# Patient Record
Sex: Male | Born: 1990 | Race: White | Hispanic: No | Marital: Single | Smoking: Former smoker
Health system: Southern US, Community
[De-identification: ages and names within clinical notes are randomized; demographics above are authoritative.]

## PROBLEM LIST (undated history)

## (undated) DIAGNOSIS — F909 Attention-deficit hyperactivity disorder, unspecified type: Secondary | ICD-10-CM

---
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
## 2009-01-05 IMAGING — CR DG FEMUR 2+V*R*
7 series · 7 of 7 positions shown · non-contrast
Comparison: none

CLINICAL DATA: Right femur pain and deformity following trauma.

RIGHT FEMUR - 7 VIEW

[w hip lateral right * (1 of 4)]
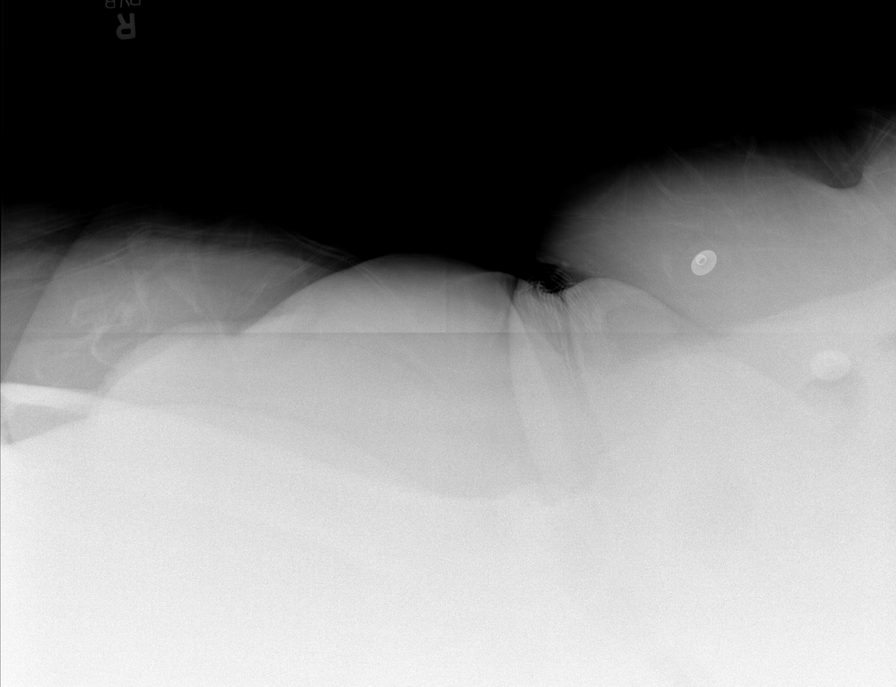

[w hip lateral right * (2 of 4)]
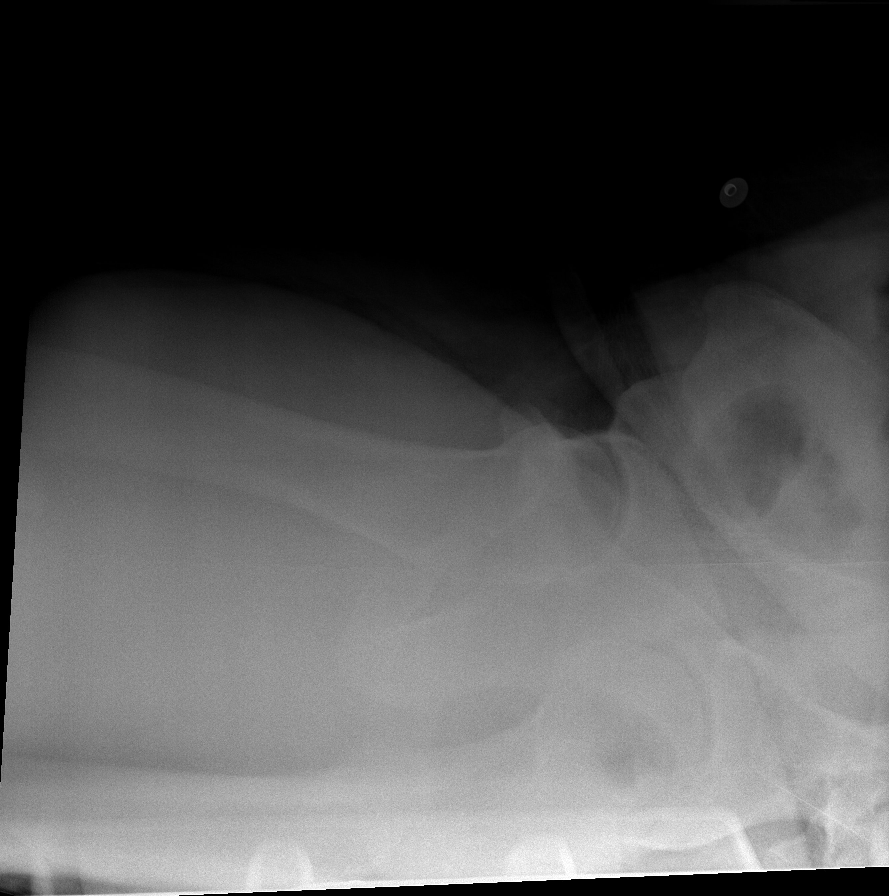

[w hip lateral right * (3 of 4)]
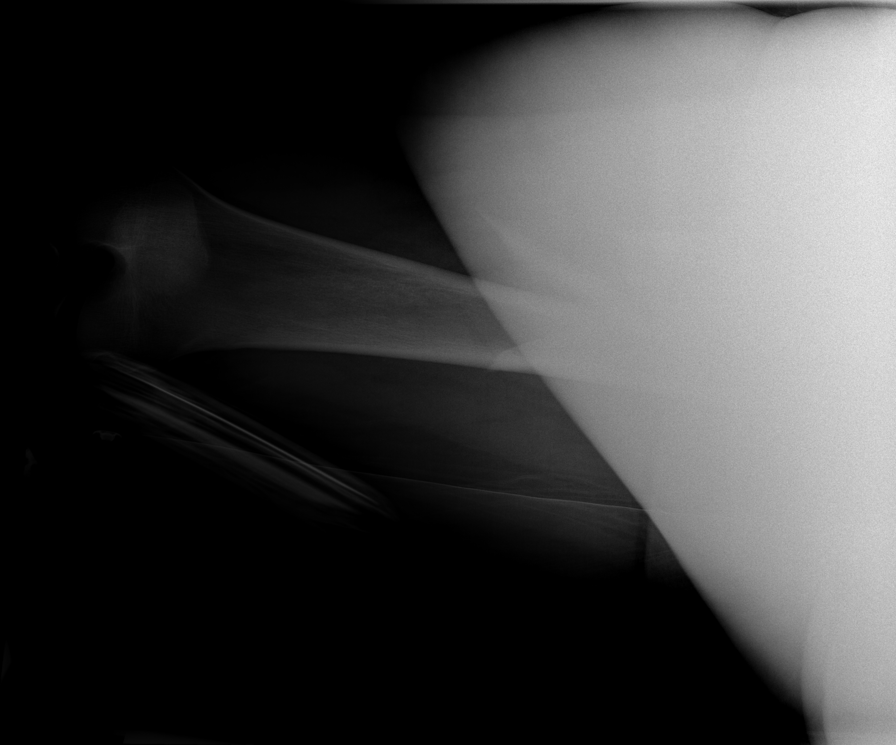

[w hip lateral right * (4 of 4)]
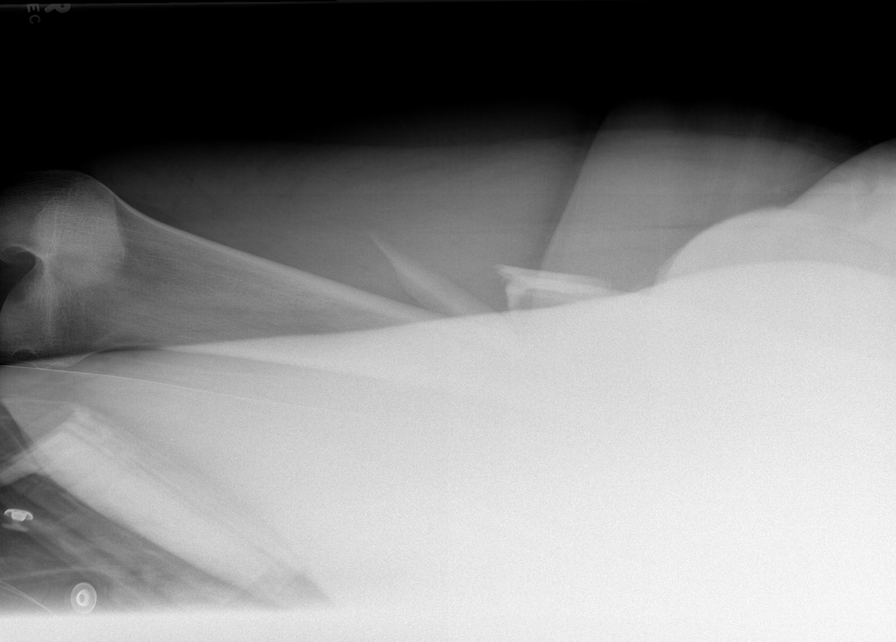

[view not recorded (1 of 3)]
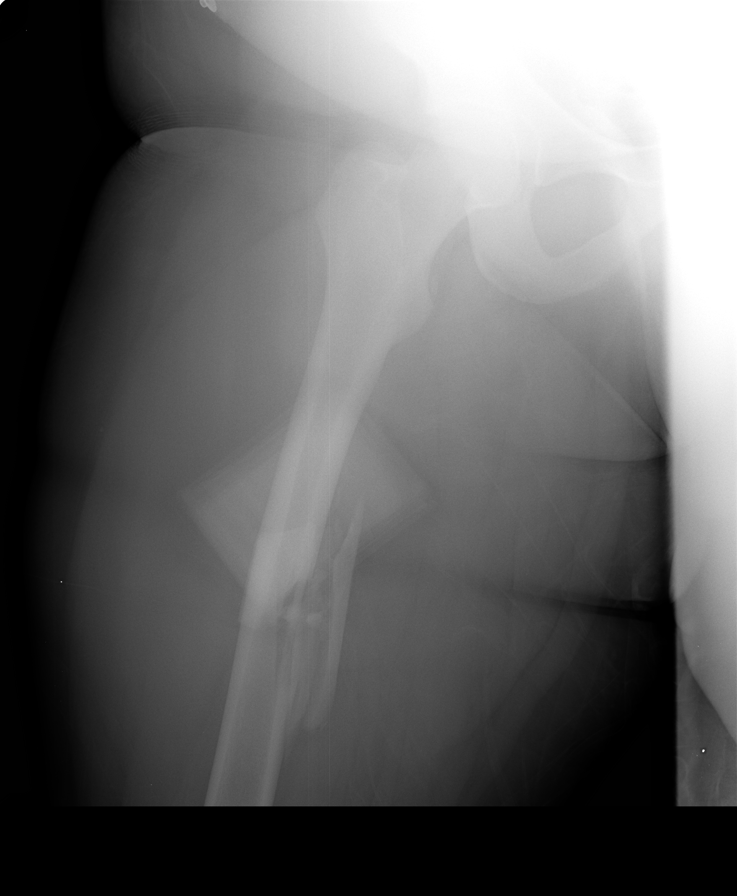

[view not recorded (2 of 3)]
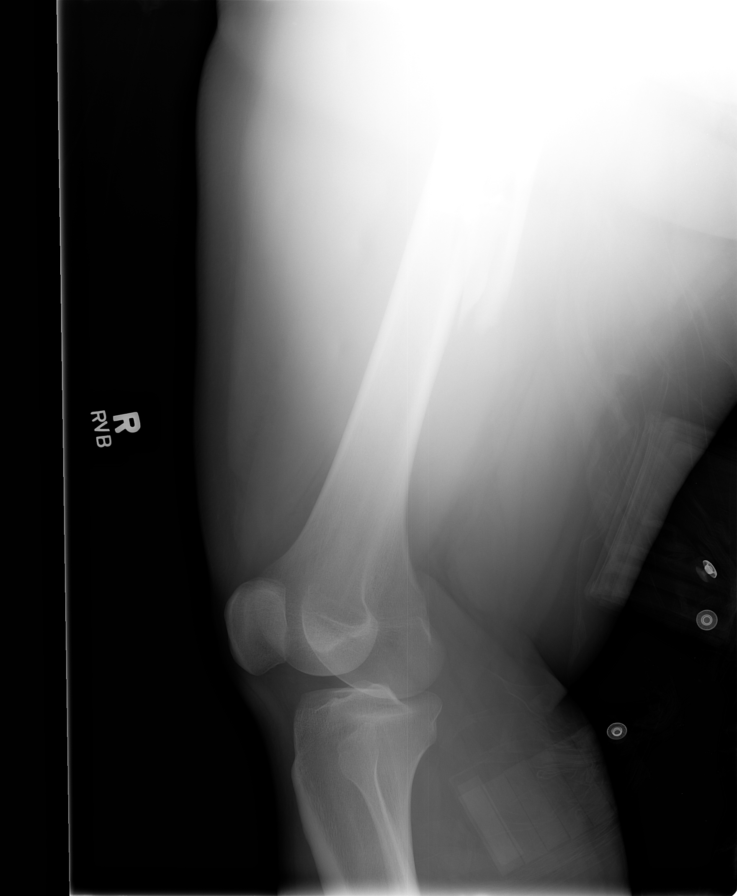

[view not recorded (3 of 3)]
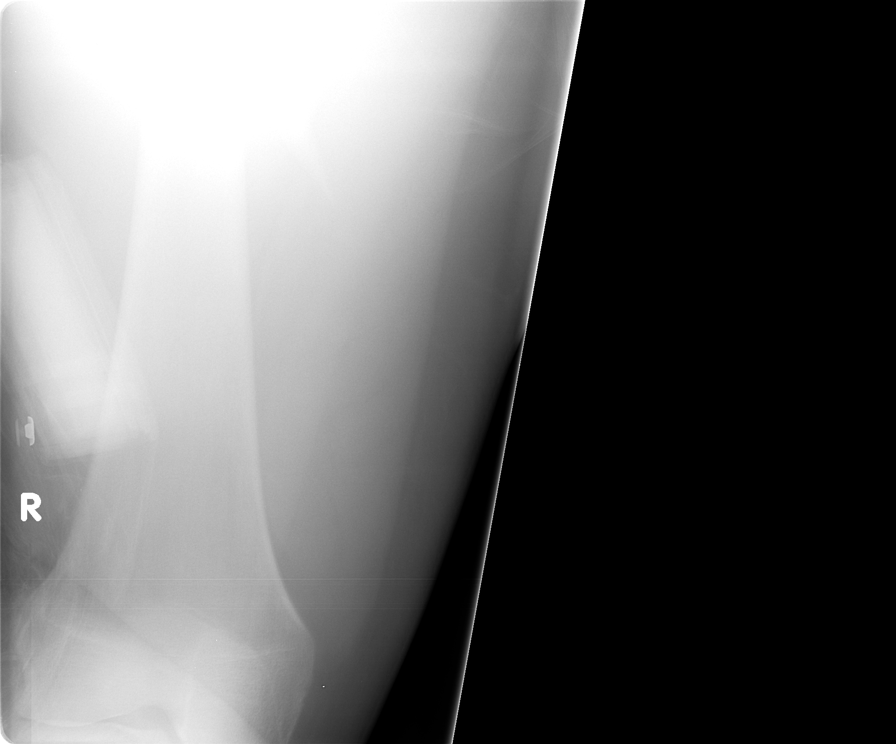

[7 of 7 positions shown; findings below may reference images not displayed]

FINDINGS: Comminuted fracture of the midshaft of the femur with one shaft width
of medial and posterior displacement of the middle fragment. There is also
proximal displacement of the distal fragment.

IMPRESSION

Comminuted mid right femoral shaft fracture, as described above.

## 2009-01-06 IMAGING — RF DG FEMUR 2+V*R*
1 series · 7 of 7 positions shown · non-contrast
Comparison: none

CLINICAL DATA: ORIF right femur

RIGHT FEMUR - 2 VIEW

[Series 1: run · 7 of 7 slices shown]
[im 1/7]
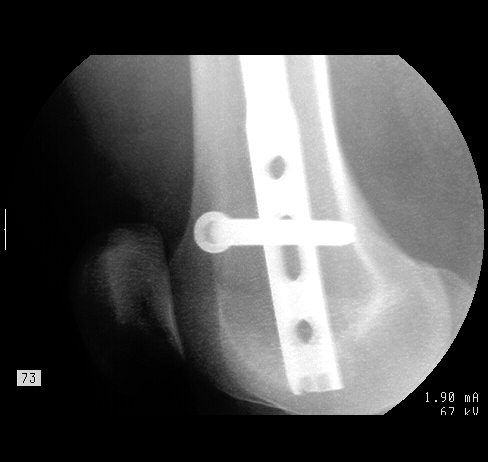
[im 2/7]
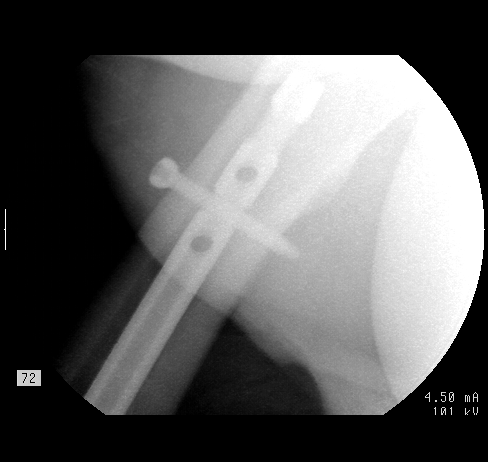
[im 3/7]
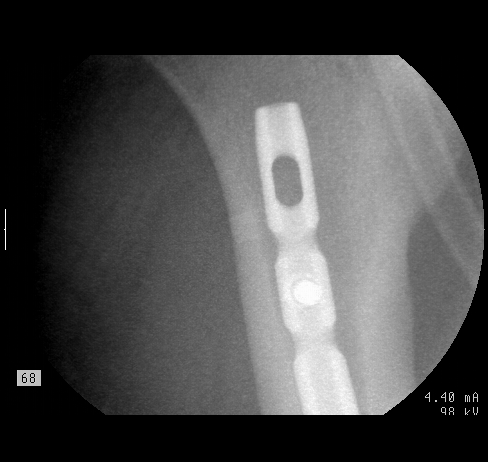
[im 4/7]
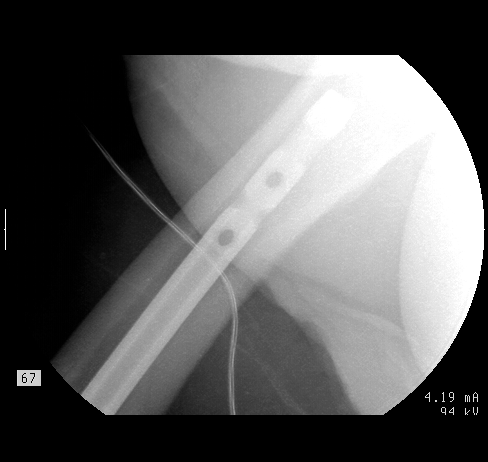
[im 5/7]
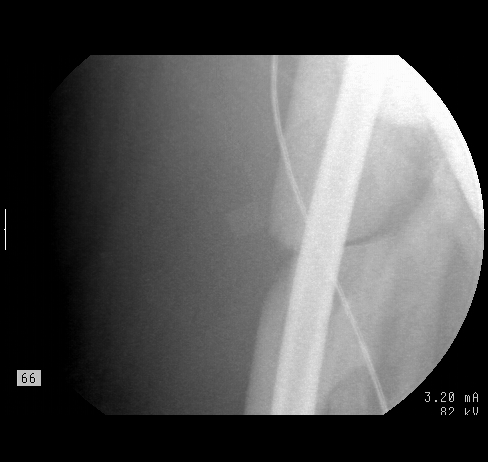
[im 6/7]
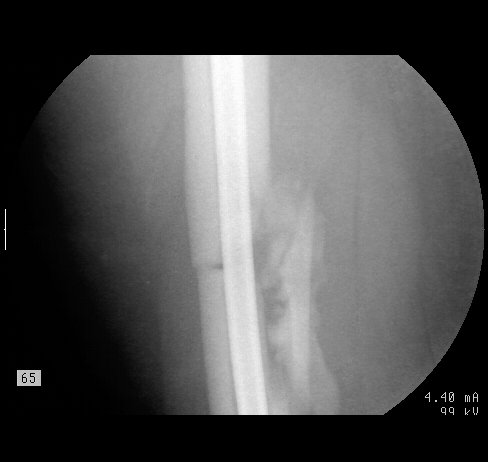
[im 7/7]
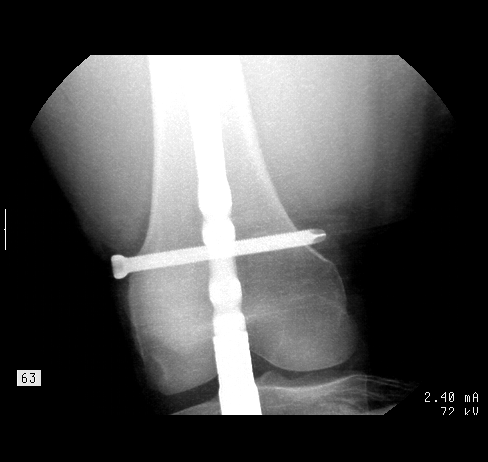

[7 of 7 positions shown; findings below may reference images not displayed]

FINDINGS: Seven intraoperative spot films are submitted. These show placement
of right femoral IM rod across the comminuted right femoral midshaft fracture.
Some comminuted fragments remain mildly displaced.

IMPRESSION

ORIF right femoral fracture.
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
## 2011-07-25 ENCOUNTER — Other Ambulatory Visit: Payer: Self-pay | Admitting: Orthopedic Surgery

## 2011-07-25 DIAGNOSIS — R52 Pain, unspecified: Secondary | ICD-10-CM

## 2011-07-27 ENCOUNTER — Ambulatory Visit
Admission: RE | Admit: 2011-07-27 | Discharge: 2011-07-27 | Disposition: A | Discharge status: Home / Self Care | Payer: 59 | Source: Ambulatory Visit | Attending: Orthopedic Surgery | Admitting: Orthopedic Surgery

## 2011-07-27 DIAGNOSIS — R52 Pain, unspecified: Secondary | ICD-10-CM

## 2011-08-09 LAB — DIFFERENTIAL
Basophils Relative: 0
Eosinophils Absolute: 0
Monocytes Relative: 6
Neutrophils Relative %: 88 — ABNORMAL HIGH

## 2011-08-09 LAB — CBC
MCHC: 35.1
MCV: 86.3
RBC: 4.81

## 2011-08-09 LAB — BASIC METABOLIC PANEL
BUN: 8
CO2: 26
Chloride: 106
Creatinine, Ser: 0.87
GFR calc Af Amer: >60

## 2011-08-09 LAB — SAMPLE TO BLOOD BANK

## 2011-11-20 HISTORY — PX: FEMUR SURGERY: SHX943

## 2016-05-02 ENCOUNTER — Emergency Department (HOSPITAL_COMMUNITY)
Admission: EM | Admit: 2016-05-02 | Discharge: 2016-05-02 | Disposition: A | Discharge status: Home / Self Care | Payer: Self-pay | Attending: Emergency Medicine | Admitting: Emergency Medicine

## 2016-05-02 ENCOUNTER — Emergency Department (HOSPITAL_COMMUNITY): Payer: Self-pay

## 2016-05-02 ENCOUNTER — Encounter (HOSPITAL_COMMUNITY): Payer: Self-pay | Admitting: Emergency Medicine

## 2016-05-02 DIAGNOSIS — IMO0001 Reserved for inherently not codable concepts without codable children: Secondary | ICD-10-CM

## 2016-05-02 DIAGNOSIS — R030 Elevated blood-pressure reading, without diagnosis of hypertension: Secondary | ICD-10-CM | POA: Insufficient documentation

## 2016-05-02 DIAGNOSIS — Z87891 Personal history of nicotine dependence: Secondary | ICD-10-CM | POA: Insufficient documentation

## 2016-05-02 DIAGNOSIS — R062 Wheezing: Secondary | ICD-10-CM | POA: Insufficient documentation

## 2016-05-02 HISTORY — DX: Attention-deficit hyperactivity disorder, unspecified type: F90.9

## 2016-05-02 MED ORDER — ALBUTEROL SULFATE HFA 108 (90 BASE) MCG/ACT IN AERS
2.0000 | INHALATION_SPRAY | RESPIRATORY_TRACT | Status: DC | PRN
  Administered 2016-05-02: 2 via RESPIRATORY_TRACT
  Filled 2016-05-02: qty 6.7

## 2016-05-02 MED ORDER — IPRATROPIUM-ALBUTEROL 0.5-2.5 (3) MG/3ML IN SOLN
3.0000 mL | Freq: Four times a day (QID) | RESPIRATORY_TRACT | Status: DC
  Administered 2016-05-02: 3 mL via RESPIRATORY_TRACT
  Filled 2016-05-02: qty 3

## 2016-05-02 NOTE — Discharge Instructions (Signed)
DASH Eating Plan °DASH stands for "Dietary Approaches to Stop Hypertension." The DASH eating plan is a healthy eating plan that has been shown to reduce high blood pressure (hypertension). Additional health benefits may include reducing the risk of type 2 diabetes mellitus, heart disease, and stroke. The DASH eating plan may also help with weight loss. °WHAT DO I NEED TO KNOW ABOUT THE DASH EATING PLAN? °For the DASH eating plan, you will follow these general guidelines: °· Choose foods with a percent daily value for sodium of less than 5% (as listed on the food label). °· Use salt-free seasonings or herbs instead of table salt or sea salt. °· Check with your health care provider or pharmacist before using salt substitutes. °· Eat lower-sodium products, often labeled as "lower sodium" or "no salt added." °· Eat fresh foods. °· Eat more vegetables, fruits, and low-fat dairy products. °· Choose whole grains. Look for the word "whole" as the first word in the ingredient list. °· Choose fish and skinless chicken or turkey more often than red meat. Limit fish, poultry, and meat to 6 oz (170 g) each day. °· Limit sweets, desserts, sugars, and sugary drinks. °· Choose heart-healthy fats. °· Limit cheese to 1 oz (28 g) per day. °· Eat more home-cooked food and less restaurant, buffet, and fast food. °· Limit fried foods. °· Cook foods using methods other than frying. °· Limit canned vegetables. If you do use them, rinse them well to decrease the sodium. °· When eating at a restaurant, ask that your food be prepared with less salt, or no salt if possible. °WHAT FOODS CAN I EAT? °Seek help from a dietitian for individual calorie needs. °Grains °Whole grain or whole wheat bread. Brown rice. Whole grain or whole wheat pasta. Quinoa, bulgur, and whole grain cereals. Low-sodium cereals. Corn or whole wheat flour tortillas. Whole grain cornbread. Whole grain crackers. Low-sodium crackers. °Vegetables °Fresh or frozen vegetables  (raw, steamed, roasted, or grilled). Low-sodium or reduced-sodium tomato and vegetable juices. Low-sodium or reduced-sodium tomato sauce and paste. Low-sodium or reduced-sodium canned vegetables.  °Fruits °All fresh, canned (in natural juice), or frozen fruits. °Meat and Other Protein Products °Ground beef (85% or leaner), grass-fed beef, or beef trimmed of fat. Skinless chicken or turkey. Ground chicken or turkey. Pork trimmed of fat. All fish and seafood. Eggs. Dried beans, peas, or lentils. Unsalted nuts and seeds. Unsalted canned beans. °Dairy °Low-fat dairy products, such as skim or 1% milk, 2% or reduced-fat cheeses, low-fat ricotta or cottage cheese, or plain low-fat yogurt. Low-sodium or reduced-sodium cheeses. °Fats and Oils °Tub margarines without trans fats. Light or reduced-fat mayonnaise and salad dressings (reduced sodium). Avocado. Safflower, olive, or canola oils. Natural peanut or almond butter. °Other °Unsalted popcorn and pretzels. °The items listed above may not be a complete list of recommended foods or beverages. Contact your dietitian for more options. °WHAT FOODS ARE NOT RECOMMENDED? °Grains °White bread. White pasta. White rice. Refined cornbread. Bagels and croissants. Crackers that contain trans fat. °Vegetables °Creamed or fried vegetables. Vegetables in a cheese sauce. Regular canned vegetables. Regular canned tomato sauce and paste. Regular tomato and vegetable juices. °Fruits °Dried fruits. Canned fruit in light or heavy syrup. Fruit juice. °Meat and Other Protein Products °Fatty cuts of meat. Ribs, chicken wings, bacon, sausage, bologna, salami, chitterlings, fatback, hot dogs, bratwurst, and packaged luncheon meats. Salted nuts and seeds. Canned beans with salt. °Dairy °Whole or 2% milk, cream, half-and-half, and cream cheese. Whole-fat or sweetened yogurt. Full-fat   cheeses or blue cheese. Nondairy creamers and whipped toppings. Processed cheese, cheese spreads, or cheese  curds. Condiments Onion and garlic salt, seasoned salt, table salt, and sea salt. Canned and packaged gravies. Worcestershire sauce. Tartar sauce. Barbecue sauce. Teriyaki sauce. Soy sauce, including reduced sodium. Steak sauce. Fish sauce. Oyster sauce. Cocktail sauce. Horseradish. Ketchup and mustard. Meat flavorings and tenderizers. Bouillon cubes. Hot sauce. Tabasco sauce. Marinades. Taco seasonings. Relishes. Fats and Oils Butter, stick margarine, lard, shortening, ghee, and bacon fat. Coconut, palm kernel, or palm oils. Regular salad dressings. Other Pickles and olives. Salted popcorn and pretzels. The items listed above may not be a complete list of foods and beverages to avoid. Contact your dietitian for more information. WHERE CAN I FIND MORE INFORMATION? National Heart, Lung, and Blood Institute: [employer]   This information is not intended to replace advice given to you by your health care provider. Make sure you discuss any questions you have with your health care provider.   Document Released: 10/25/2011 Document Revised: 11/26/2014 Document Reviewed: 09/09/2013 Elsevier Interactive Patient Education 2016 Elsevier Inc. Bronchospasm, Adult A bronchospasm is when the tubes that carry air in and out of your lungs (airways) spasm or tighten. During a bronchospasm it is hard to breathe. This is because the airways get smaller. A bronchospasm can be triggered by:  Allergies. These may be to animals, pollen, food, or mold.  Infection. This is a common cause of bronchospasm.  Exercise.  Irritants. These include pollution, cigarette smoke, strong odors, aerosol sprays, and paint fumes.  Weather changes.  Stress.  Being emotional. HOME CARE   Always have a plan for getting help. Know when to call your doctor and local emergency services (911 in the [place] Know where you can get emergency care.  Only take medicines as told by your  doctor.  If you were prescribed an inhaler or nebulizer machine, ask your doctor how to use it correctly. Always use a spacer with your inhaler if you were given one.  Stay calm during an attack. Try to relax and breathe more slowly.  Control your home environment:  Change your heating and air conditioning filter at least once a month.  Limit your use of fireplaces and wood stoves.  Do not  smoke. Do not  allow smoking in your home.  Avoid perfumes and fragrances.  Get rid of pests (such as roaches and mice) and their droppings.  Throw away plants if you see mold on them.  Keep your house clean and dust free.  Replace carpet with wood, tile, or vinyl flooring. Carpet can trap dander and dust.  Use allergy-proof pillows, mattress covers, and box spring covers.  Wash bed sheets and blankets every week in hot water. Dry them in a dryer.  Use blankets that are made of polyester or cotton.  Wash hands frequently. GET HELP IF:  You have muscle aches.  You have chest pain.  The thick spit you spit or cough up (sputum) changes from clear or white to yellow, green, gray, or bloody.  The thick spit you spit or cough up gets thicker.  There are problems that may be related to the medicine you are given such as:  A rash.  Itching.  Swelling.  Trouble breathing. GET HELP RIGHT AWAY IF:  You feel you cannot breathe or catch your breath.  You cannot stop coughing.  Your treatment is not helping you breathe better.  You have very bad chest pain. MAKE SURE YOU:   Understand  these instructions.  Will watch your condition.  Will get help right away if you are not doing well or get worse.   This information is not intended to replace advice given to you by your health care provider. Make sure you discuss any questions you have with your health care provider.   Document Released: 09/02/2009 Document Revised: 11/26/2014 Document Reviewed: 04/28/2013 Elsevier  Interactive Patient Education 2016 [employer]. Hypertension Hypertension, commonly called high blood pressure, is when the force of blood pumping through your arteries is too strong. Your arteries are the blood vessels that carry blood from your heart throughout your body. A blood pressure reading consists of a higher number over a lower number, such as 110/72. The higher number (systolic) is the pressure inside your arteries when your heart pumps. The lower number (diastolic) is the pressure inside your arteries when your heart relaxes. Ideally you want your blood pressure below 120/80. Hypertension forces your heart to work harder to pump blood. Your arteries may become narrow or stiff. Having untreated or uncontrolled hypertension can cause heart attack, stroke, kidney disease, and other problems. RISK FACTORS Some risk factors for high blood pressure are controllable. Others are not.  Risk factors you cannot control include:   Race. You may be at higher risk if you are African American.  Age. Risk increases with age.  Gender. Men are at higher risk than women before age 63 years. After age 54, women are at higher risk than men. Risk factors you can control include:  Not getting enough exercise or physical activity.  Being overweight.  Getting too much fat, sugar, calories, or salt in your diet.  Drinking too much alcohol. SIGNS AND SYMPTOMS Hypertension does not usually cause signs or symptoms. Extremely high blood pressure (hypertensive crisis) may cause headache, anxiety, shortness of breath, and nosebleed. DIAGNOSIS To check if you have hypertension, your health care provider will measure your blood pressure while you are seated, with your arm held at the level of your heart. It should be measured at least twice using the same arm. Certain conditions can cause a difference in blood pressure between your right and left arms. A blood pressure reading that is higher than normal on one  occasion does not mean that you need treatment. If it is not clear whether you have high blood pressure, you may be asked to return on a different day to have your blood pressure checked again. Or, you may be asked to monitor your blood pressure at home for 1 or more weeks. TREATMENT Treating high blood pressure includes making lifestyle changes and possibly taking medicine. Living a healthy lifestyle can help lower high blood pressure. You may need to change some of your habits. Lifestyle changes may include:  Following the DASH diet. This diet is high in fruits, vegetables, and whole grains. It is low in salt, red meat, and added sugars.  Keep your sodium intake below 2,300 mg per day.  Getting at least 30-45 minutes of aerobic exercise at least 4 times per week.  Losing weight if necessary.  Not smoking.  Limiting alcoholic beverages.  Learning ways to reduce stress. Your health care provider may prescribe medicine if lifestyle changes are not enough to get your blood pressure under control, and if one of the following is true:  You are 67-39 years of age and your systolic blood pressure is above 140.  You are 50 years of age or older, and your systolic blood pressure is above 150.  Your diastolic blood pressure is above 90.  You have diabetes, and your systolic blood pressure is over 140 or your diastolic blood pressure is over 90.  You have kidney disease and your blood pressure is above 140/90.  You have heart disease and your blood pressure is above 140/90. Your personal target blood pressure may vary depending on your medical conditions, your age, and other factors. HOME CARE INSTRUCTIONS  Have your blood pressure rechecked as directed by your health care provider.   Take medicines only as directed by your health care provider. Follow the directions carefully. Blood pressure medicines must be taken as prescribed. The medicine does not work as well when you skip doses.  Skipping doses also puts you at risk for problems.  Do not smoke.   Monitor your blood pressure at home as directed by your health care provider. SEEK MEDICAL CARE IF:   You think you are having a reaction to medicines taken.  You have recurrent headaches or feel dizzy.  You have swelling in your ankles.  You have trouble with your vision. SEEK IMMEDIATE MEDICAL CARE IF:  You develop a severe headache or confusion.  You have unusual weakness, numbness, or feel faint.  You have severe chest or abdominal pain.  You vomit repeatedly.  You have trouble breathing. MAKE SURE YOU:   Understand these instructions.  Will watch your condition.  Will get help right away if you are not doing well or get worse.   This information is not intended to replace advice given to you by your health care provider. Make sure you discuss any questions you have with your health care provider.   Document Released: 11/05/2005 Document Revised: 03/22/2015 Document Reviewed: 08/28/2013 Elsevier Interactive Patient Education [employer] Elsevier Inc.

## 2016-05-02 NOTE — ED Notes (Signed)
Pt reports ate breakfast yesterday and reports sudden onset SOB. Pt reports episode lasted a few minutes and reports blood pressure has been intermittently high ever since. Pt denies sob,cp at this time. nad noted. Pt reports PCP referred him to ER for evaluation.

## 2016-05-02 NOTE — ED Provider Notes (Signed)
CSN: [id number]     Arrival date & time 05/02/16  0807 History   First MD Initiated Contact with Patient 05/02/16 [phone]     Chief Complaint  Patient presents with  . Hypertension     (Consider location/radiation/quality/duration/timing/severity/associated sxs/prior Treatment) HPI Comments: Patient presents to the ED with a chief complaint of hypertension and SOB yesterday.  Patient states that he was at work and had just finished eating a biscuit when he began feeling short of breath.  He denies any associated chest pain, cough, or fever.  Denies any known exacerbating factors.  States that after about 10-15 minutes he was able to breath normally again.  He denies any rashes, nausea, vomiting, or diarrhea.  States that when he got home he took his BP and found it to be high.  He states that he is concerned because his father and grandfather both died at age 27 from MIs.  He is an everyday smoker.  He denies any other medical problems.  Denies any hx of PE/DVT.  The history is provided by the patient. No language interpreter was used.    Past Medical History  Diagnosis Date  . ADHD (attention deficit hyperactivity disorder)    Past Surgical History  Procedure Laterality Date  . Femur surgery Right 2013   History reviewed. No pertinent family history. Social History  Substance Use Topics  . Smoking status: Former Smoker    Types: Cigarettes    Quit date: 05/01/2016  . Smokeless tobacco: None  . Alcohol Use: No    Review of Systems  Constitutional: Negative for fever and chills.  Respiratory: Positive for shortness of breath.   Cardiovascular: Negative for chest pain.  Gastrointestinal: Negative for nausea, vomiting, diarrhea and constipation.  Genitourinary: Negative for dysuria.  All other systems reviewed and are negative.     Allergies  Review of patient's allergies indicates not on file.  Home Medications   Prior to Admission medications   Not on File   BP 148/99  mmHg  Pulse 74  Temp(Src) 98.1 F (36.7 C) (Oral)  Resp 18  Ht 5\' 10"  (1.778 m)  Wt 108.863 kg  BMI 34.44 kg/m2  SpO2 98% Physical Exam  Constitutional: He is oriented to person, place, and time. He appears well-developed and well-nourished.  HENT:  Head: Normocephalic and atraumatic.  Eyes: Conjunctivae and EOM are normal. Pupils are equal, round, and reactive to light. Right eye exhibits no discharge. Left eye exhibits no discharge. No scleral icterus.  Neck: Normal range of motion. Neck supple. No JVD present.  Cardiovascular: Normal rate, regular rhythm and normal heart sounds.  Exam reveals no gallop and no friction rub.   No murmur heard. Pulmonary/Chest: Effort normal. No respiratory distress. He has wheezes. He has no rales. He exhibits no tenderness.  Mild wheezes  Abdominal: Soft. He exhibits no distension and no mass. There is no tenderness. There is no rebound and no guarding.  Musculoskeletal: Normal range of motion. He exhibits no edema or tenderness.  Neurological: He is alert and oriented to person, place, and time.  Skin: Skin is warm and dry.  Psychiatric: He has a normal mood and affect. His behavior is normal. Judgment and thought content normal.  Nursing note and vitals reviewed.   ED Course  Procedures (including critical care time) Dg Chest 2 View  05/02/2016  CLINICAL DATA:  Shortness of Breath EXAM: CHEST  2 VIEW COMPARISON:  None. FINDINGS: Lungs are clear. Heart size and pulmonary vascularity are  normal. No adenopathy. No bone lesions. IMPRESSION: No edema or consolidation. Electronically Signed   By: Bretta Bang III M.D.   On: 05/02/2016 09:02     I have personally reviewed and evaluated these image results as part of my medical decision-making.    EKG Interpretation   Date/Time:  Wednesday May 02 2016 08:44:57 EDT Ventricular Rate:  72 PR Interval:  158 QRS Duration: 88 QT Interval:  372 QTC Calculation: 407 R Axis:   2 Text  Interpretation:  Sinus rhythm Confirmed by ZAVITZ MD, JOSHUA [phone])  on 05/02/2016 8:49:40 AM      MDM   Final diagnoses:  Wheeze  Elevated BP    Patient with SOB yesterday.  Has a few small wheezes on exam.  Will give nebs, check CXR, and EKG.  Low risk for ACS.  Noted to by hypertensive to 148/99.    CXR is clear, normal EKG.  No SOB today.  Will recommend PCP follow-up for BP management.  Asymptomatic at this time.  9:56 AM Reexamined.  No further wheezing.  Patient feels better after breathing treatment.  Will DC with inhaler.  Recommend PCP follow-up.    Roxy Horseman, PA-C 05/02/16 [id number]  Blane Ohara, MD 05/02/16 [phone]

## 2017-01-02 ENCOUNTER — Ambulatory Visit: Payer: Self-pay | Admitting: Family Medicine

## 2017-01-03 ENCOUNTER — Encounter: Payer: Self-pay | Admitting: Family Medicine

## 2017-06-17 IMAGING — DX DG CHEST 2V
2 series · 2 of 2 positions shown · non-contrast
Comparison: None.

CLINICAL DATA: Shortness of Breath

EXAM:
CHEST  2 VIEW

[chest pa]
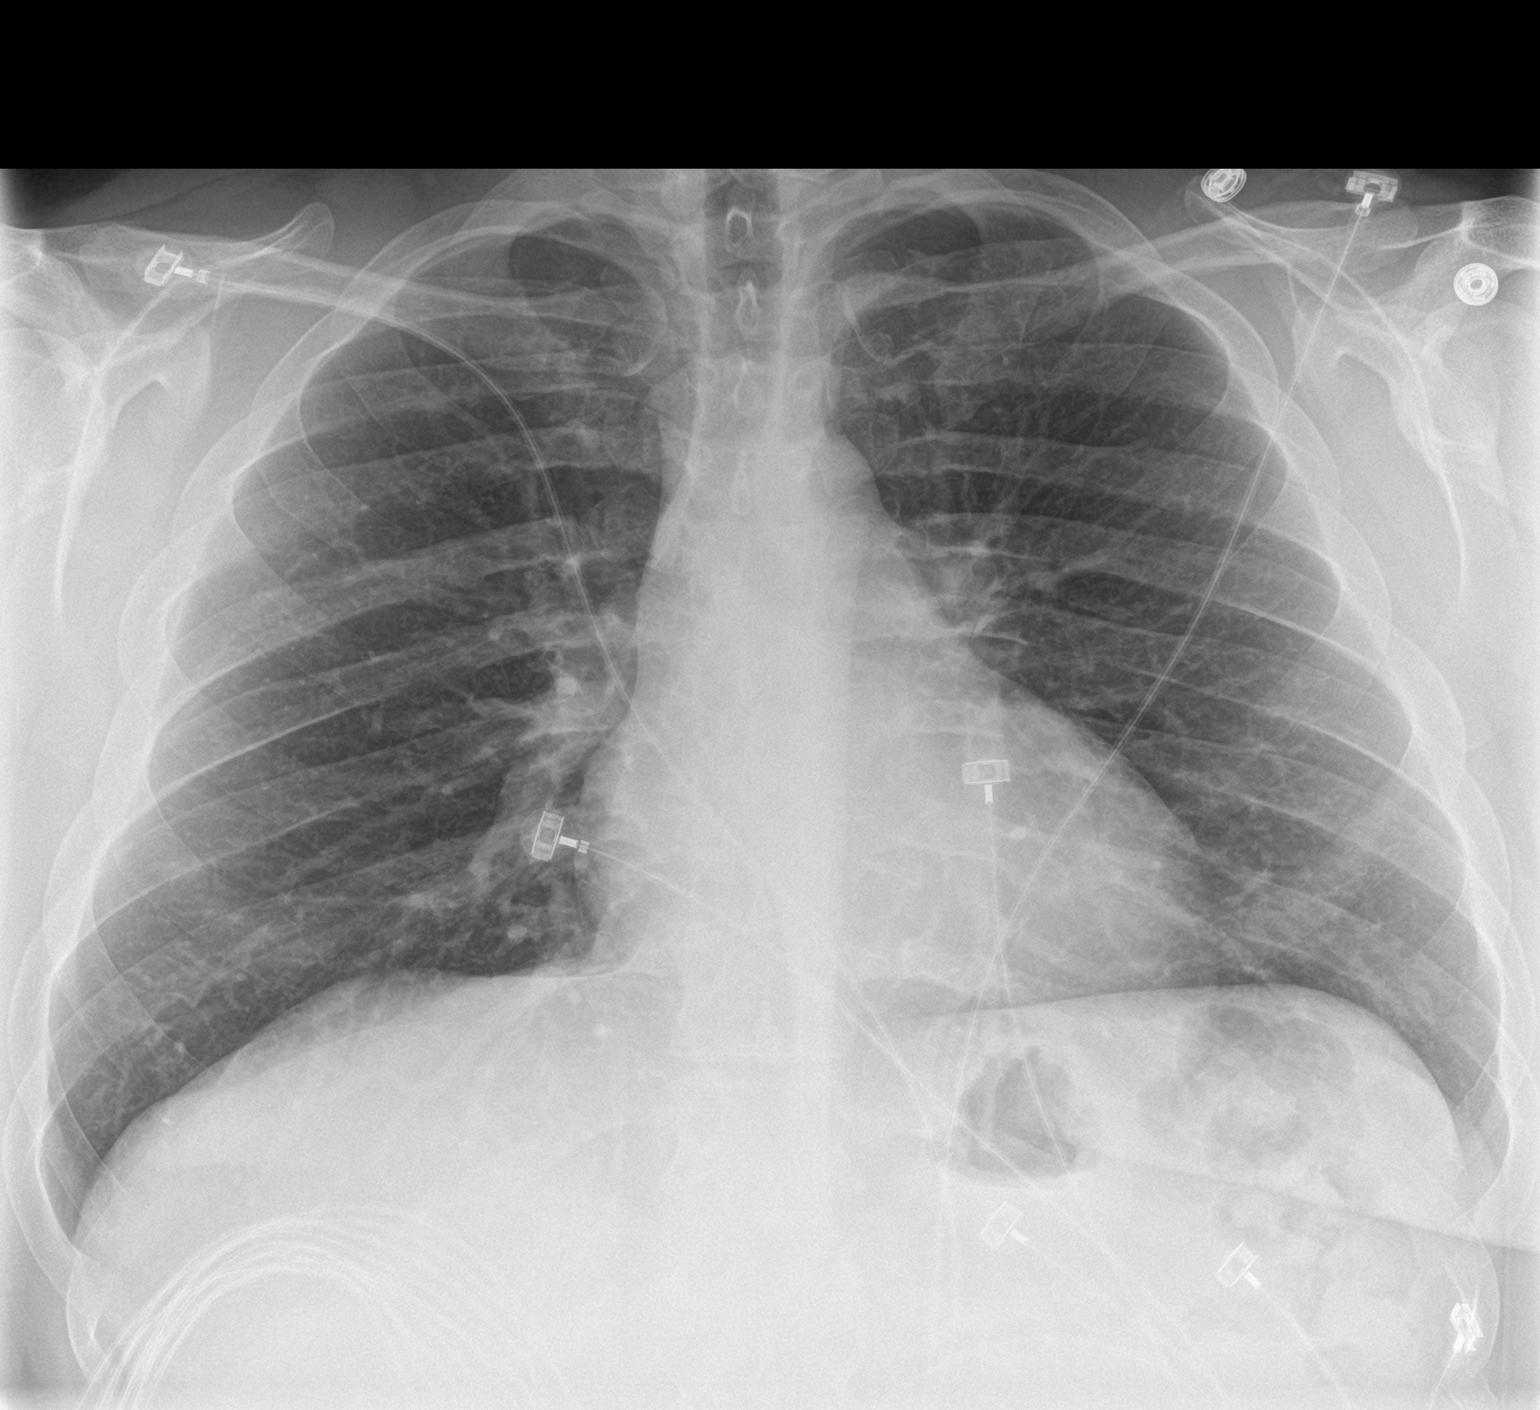

[chest lat]
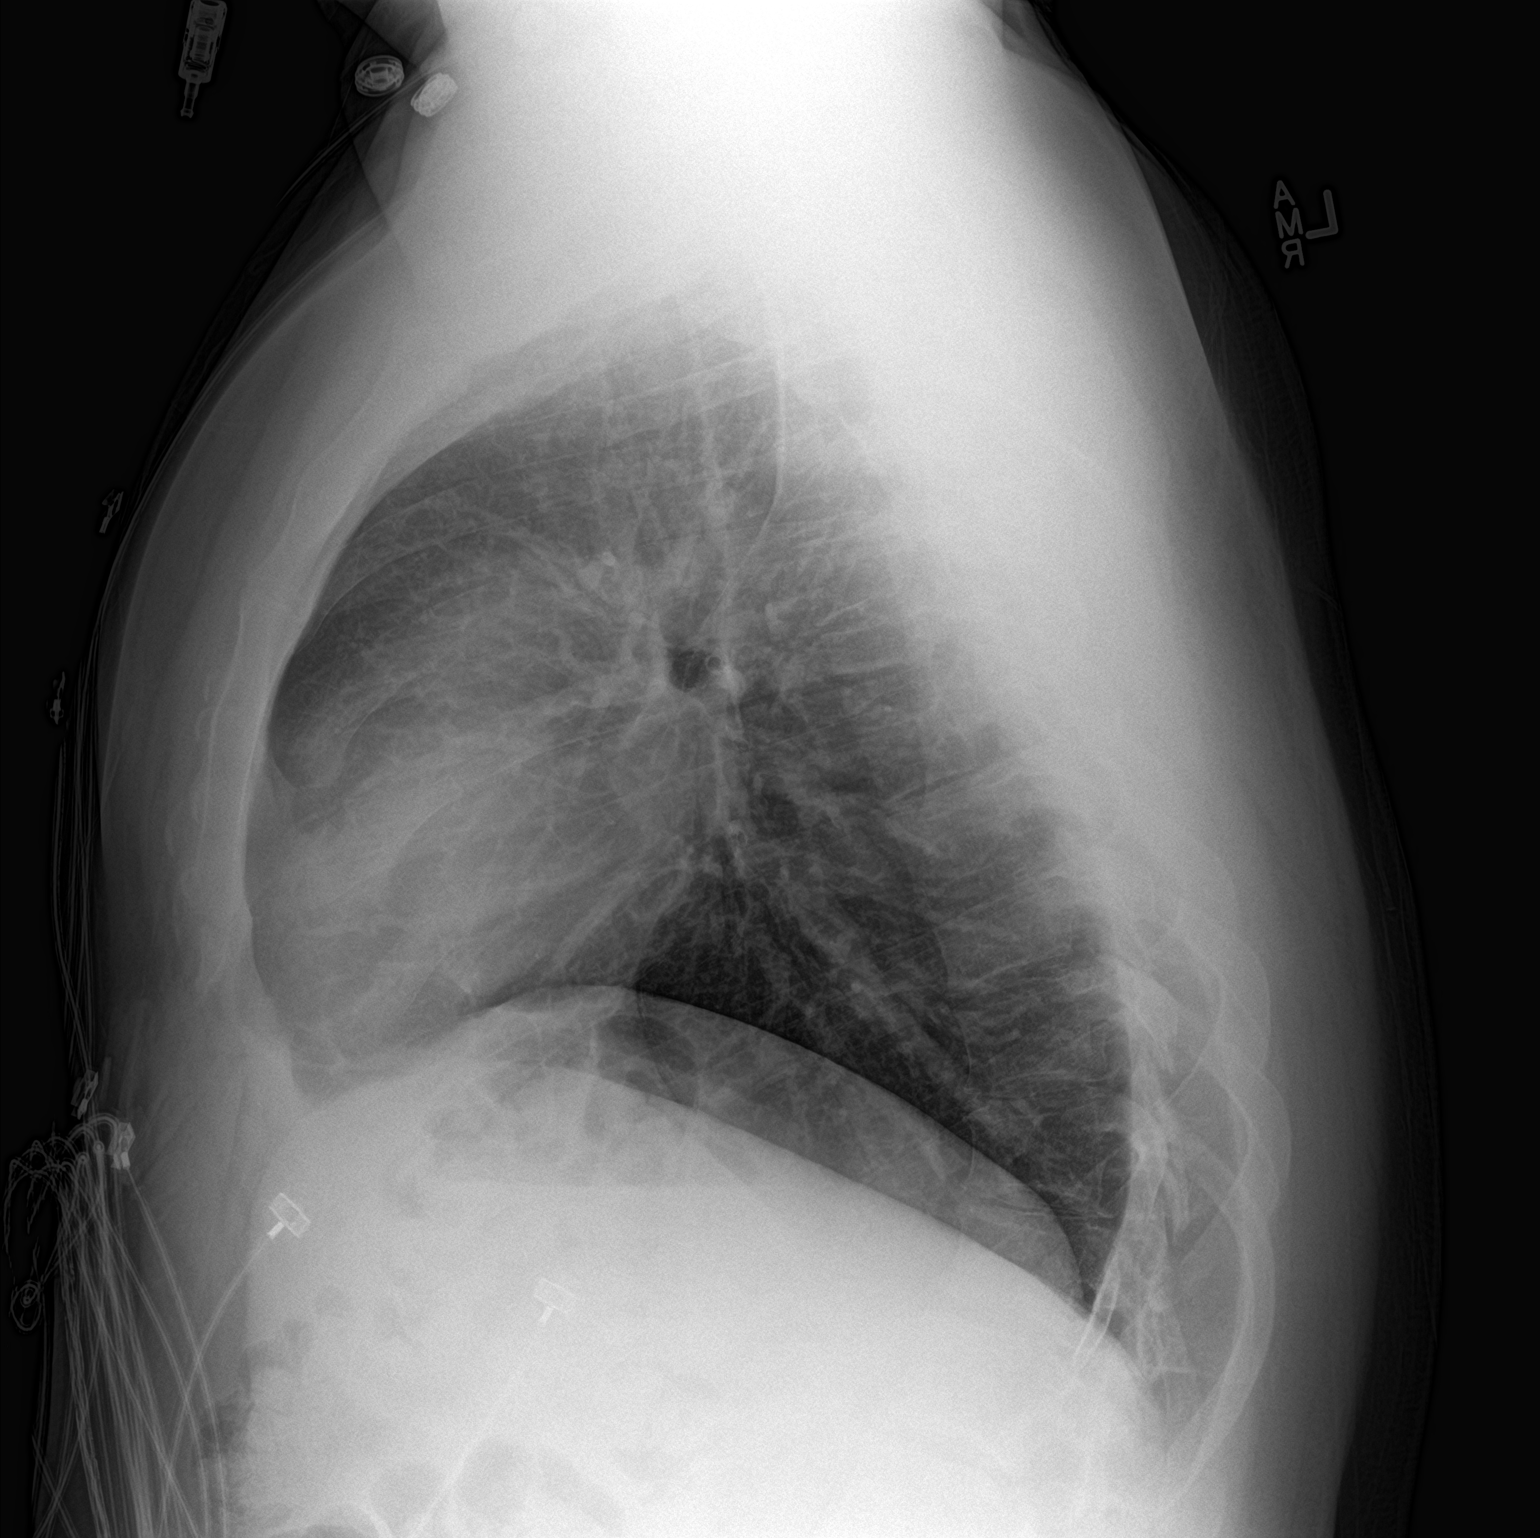

[2 of 2 positions shown; findings below may reference images not displayed]

FINDINGS: Lungs are clear. Heart size and pulmonary vascularity are normal. No
adenopathy. No bone lesions.
IMPRESSION: No edema or consolidation.
